# Patient Record
Sex: Male | Born: 1970 | Race: White | Hispanic: No | Marital: Married | Smoking: Former smoker
Health system: Southern US, Community
[De-identification: ages and names within clinical notes are randomized; demographics above are authoritative.]

## PROBLEM LIST (undated history)

## (undated) DIAGNOSIS — F419 Anxiety disorder, unspecified: Secondary | ICD-10-CM

## (undated) DIAGNOSIS — K219 Gastro-esophageal reflux disease without esophagitis: Secondary | ICD-10-CM

## (undated) DIAGNOSIS — E785 Hyperlipidemia, unspecified: Secondary | ICD-10-CM

---

## 2019-06-04 ENCOUNTER — Other Ambulatory Visit: Payer: Self-pay

## 2019-06-04 ENCOUNTER — Encounter: Payer: Self-pay | Admitting: Emergency Medicine

## 2019-06-04 ENCOUNTER — Ambulatory Visit
Admission: EM | Admit: 2019-06-04 | Discharge: 2019-06-04 | Disposition: A | Discharge status: Home / Self Care | Payer: No Typology Code available for payment source | Attending: Family Medicine | Admitting: Family Medicine

## 2019-06-04 DIAGNOSIS — R1032 Left lower quadrant pain: Secondary | ICD-10-CM | POA: Insufficient documentation

## 2019-06-04 DIAGNOSIS — N401 Enlarged prostate with lower urinary tract symptoms: Secondary | ICD-10-CM | POA: Diagnosis present

## 2019-06-04 HISTORY — DX: Gastro-esophageal reflux disease without esophagitis: K21.9

## 2019-06-04 HISTORY — DX: Hyperlipidemia, unspecified: E78.5

## 2019-06-04 HISTORY — DX: Anxiety disorder, unspecified: F41.9

## 2019-06-04 LAB — URINALYSIS, COMPLETE (UACMP) WITH MICROSCOPIC
Bacteria, UA: NONE SEEN
Bilirubin Urine: NEGATIVE
Glucose, UA: NEGATIVE mg/dL
Hgb urine dipstick: NEGATIVE
Ketones, ur: NEGATIVE mg/dL
Leukocytes,Ua: NEGATIVE
Nitrite: NEGATIVE
Protein, ur: NEGATIVE mg/dL
Specific Gravity, Urine: 1.025 (ref 1.005–1.030)
Squamous Epithelial / HPF: NONE SEEN (ref 0–5)
pH: 7 (ref 5.0–8.0)

## 2019-06-04 MED ORDER — AMOXICILLIN-POT CLAVULANATE 875-125 MG PO TABS: 1.0000 | ORAL_TABLET | Freq: Two times a day (BID) | ORAL | 0 refills | Status: DC

## 2019-06-04 MED ORDER — TAMSULOSIN HCL 0.4 MG PO CAPS: 0.4000 mg | ORAL_CAPSULE | Freq: Every day | ORAL | 0 refills | Status: DC

## 2019-06-04 NOTE — ED Triage Notes (Signed)
Patient c/o lower mid abdominal pain that started a month ago.  Patient c/o urinary frequency that started last week.  Patient denies fevers.   Patient denies N/V/D.

## 2019-06-06 NOTE — ED Provider Notes (Signed)
[facility]    CSN: [id number] Arrival date & time: 06/04/19  1335      History   Chief Complaint Chief Complaint  Patient presents with  . Abdominal Pain    HPI Christian Morton is a 49 y.o. male.   49 yo male with a c/o mid and left lower abdominal pains for the past month and increased urinary frequency for the past week. States he gets up 2 times per night to urinate and has sensation of incomplete emptying. Denies any fevers, chills, vomiting, nausea, diarrhea, constipation, melena, hematochezia, back pain.    Abdominal Pain   Past Medical History:  Diagnosis Date  . Anxiety   . GERD (gastroesophageal reflux disease)   . Hyperlipidemia     There are no problems to display for this patient.   History reviewed. No pertinent surgical history.     Home Medications    Prior to Admission medications   Medication Sig Start Date End Date Taking? Authorizing Provider  buPROPion (WELLBUTRIN XL) 300 MG 24 hr tablet bupropion HCl XL 300 mg 24 hr tablet, extended release   Yes [provider]  pantoprazole (PROTONIX) 40 MG tablet pantoprazole 40 mg tablet,delayed release   Yes [provider]  simvastatin (ZOCOR) 80 MG tablet simvastatin 80 mg tablet   Yes [provider]  amoxicillin-clavulanate (AUGMENTIN) 875-125 MG tablet Take 1 tablet by mouth 2 (two) times daily. 06/04/19   Norval Gable, MD  tamsulosin (FLOMAX) 0.4 MG CAPS capsule Take 1 capsule (0.4 mg total) by mouth daily. 06/04/19   Norval Gable, MD    Family History Family History  Problem Relation Age of Onset  . Cancer Mother   . Healthy Father     Social History Social History   Tobacco Use  . Smoking status: Former Research scientist (life sciences)  . Smokeless tobacco: Never Used  Substance Use Topics  . Alcohol use: Yes  . Drug use: Never     Allergies   Patient has no known allergies.   Review of Systems Review of Systems  Gastrointestinal: Positive for abdominal pain.      Physical Exam Triage Vital Signs ED Triage Vitals  Enc Vitals Group     BP 06/04/19 1403 132/90     Pulse Rate 06/04/19 1403 82     Resp 06/04/19 1403 16     Temp 06/04/19 1403 98.5 F (36.9 C)     Temp Source 06/04/19 1403 Oral     SpO2 06/04/19 1403 100 %     Weight 06/04/19 1358 200 lb (90.7 kg)     Height 06/04/19 1358 6\' 2"  (1.88 m)     Head Circumference --      Peak Flow --      Pain Score 06/04/19 1358 2     Pain Loc --      Pain Edu? --      Excl. in [place]? --    No data found.  Updated Vital Signs BP 132/90 (BP Location: Right Arm)   Pulse 82   Temp 98.5 F (36.9 C) (Oral)   Resp 16   Ht 6\' 2"  (1.88 m)   Wt 90.7 kg   SpO2 100%   BMI 25.68 kg/m   Visual Acuity Right Eye Distance:   Left Eye Distance:   Bilateral Distance:    Right Eye Near:   Left Eye Near:    Bilateral Near:     Physical Exam Vitals and nursing note reviewed.  Constitutional:  General: He is not in acute distress.    Appearance: He is not toxic-appearing or diaphoretic.  Abdominal:     General: Bowel sounds are normal. There is no distension.     Palpations: Abdomen is soft. There is no mass.     Tenderness: There is abdominal tenderness (mild; left lower quadrant; no rebound or guarding). There is no right CVA tenderness, left CVA tenderness, guarding or rebound.     Hernia: No hernia is present. There is no hernia in the left inguinal area or right inguinal area.  Genitourinary:    Penis: Normal.      Testes: Normal.     Prostate: Enlarged (difffusely). Not tender and no nodules present.  Neurological:     Mental Status: He is alert.      UC Treatments / Results  Labs (all labs ordered are listed, but only abnormal results are displayed) Labs Reviewed  URINALYSIS, COMPLETE (UACMP) WITH MICROSCOPIC    EKG   Radiology No results found.  Procedures Procedures (including critical care time)  Medications Ordered in UC Medications - No data to  display  Initial Impression / Assessment and Plan / UC Course  I have reviewed the triage vital signs and the nursing notes.  Pertinent labs & imaging results that were available during my care of the patient were reviewed by me and considered in my medical decision making (see chart for details).      Final Clinical Impressions(s) / UC Diagnoses   Final diagnoses:  Benign prostatic hyperplasia with lower urinary tract symptoms, symptom details unspecified  Abdominal pain, left lower quadrant    ED Prescriptions    Medication Sig Dispense Auth. Provider   amoxicillin-clavulanate (AUGMENTIN) 875-125 MG tablet Take 1 tablet by mouth 2 (two) times daily. 20 tablet Payton Mccallum, MD   tamsulosin (FLOMAX) 0.4 MG CAPS capsule Take 1 capsule (0.4 mg total) by mouth daily. 30 capsule Payton Mccallum, MD     1. Lab results (negative UA) and diagnosis reviewed with patient 2. rx as per orders above; reviewed possible side effects, interactions, risks and benefits  3. Recommend supportive treatment with increase water intake, otc analgesics prn 4. Follow-up prn if symptoms worsen or don't improve   PDMP not reviewed this encounter.   Payton Mccallum, MD 06/06/19 1154

## 2020-04-14 ENCOUNTER — Other Ambulatory Visit: Payer: Self-pay

## 2020-04-14 ENCOUNTER — Ambulatory Visit
Admission: EM | Admit: 2020-04-14 | Discharge: 2020-04-14 | Disposition: A | Discharge status: Home / Self Care | Payer: No Typology Code available for payment source | Attending: Family Medicine | Admitting: Family Medicine

## 2020-04-14 DIAGNOSIS — U071 COVID-19: Secondary | ICD-10-CM

## 2020-04-14 MED ORDER — ALBUTEROL SULFATE HFA 108 (90 BASE) MCG/ACT IN AERS: 1.0000 | INHALATION_SPRAY | Freq: Four times a day (QID) | RESPIRATORY_TRACT | 0 refills | Status: AC | PRN

## 2020-04-14 MED ORDER — BENZONATATE 200 MG PO CAPS: 200.0000 mg | ORAL_CAPSULE | Freq: Three times a day (TID) | ORAL | 0 refills | Status: DC | PRN

## 2020-04-14 NOTE — ED Triage Notes (Signed)
Pt c/o recent COVID positive test at home about 2 weeks ago. Pt was also tested at [facility] and [facility] and those were also positive. Pt states his symptoms started about 04/02/20 and he took the first test 04/04/20. Pt states he still has a dry cough, occasionally productive. Pt reports minimal shob, denies wheeze/chest tightness. Pt is asking for inhaler to help with cough. Pt also needs note to RTW. Pt denies f/n/v/d or other symptoms.

## 2020-04-14 NOTE — ED Provider Notes (Signed)
[facility]    CSN: [id number] Arrival date & time: 04/14/20  0816      History   Chief Complaint Chief Complaint  Patient presents with  . Cough   HPI  50 year old male presents with the above complaint.   Tested positive for COVID on 2/1.  He states that he has had fatigue.  He has had ongoing cough.  He is feeling improved but is still bothered by the cough.  No shortness of breath.  No fever.  He has no other complaints or concerns at this time.  He does need a note for work.  Past Medical History:  Diagnosis Date  . Anxiety   . GERD (gastroesophageal reflux disease)   . Hyperlipidemia     Home Medications    Prior to Admission medications   Medication Sig Start Date End Date Taking? Authorizing Provider  albuterol (VENTOLIN HFA) 108 (90 Base) MCG/ACT inhaler Inhale 1-2 puffs into the lungs every 6 (six) hours as needed for wheezing or shortness of breath. 04/14/20  Yes Hildegard Hlavac G, DO  benzonatate (TESSALON) 200 MG capsule Take 1 capsule (200 mg total) by mouth 3 (three) times daily as needed for cough. 04/14/20  Yes Tierre Netto G, DO  buPROPion (WELLBUTRIN XL) 300 MG 24 hr tablet bupropion HCl XL 300 mg 24 hr tablet, extended release   Yes [provider]  pantoprazole (PROTONIX) 40 MG tablet pantoprazole 40 mg tablet,delayed release   Yes [provider]  simvastatin (ZOCOR) 80 MG tablet simvastatin 80 mg tablet   Yes [provider]  tamsulosin (FLOMAX) 0.4 MG CAPS capsule Take 1 capsule (0.4 mg total) by mouth daily. 06/04/19   Payton Mccallum, MD    Family History Family History  Problem Relation Age of Onset  . Cancer Mother   . Healthy Father     Social History Social History   Tobacco Use  . Smoking status: Former Games developer  . Smokeless tobacco: Never Used  Vaping Use  . Vaping Use: Never used  Substance Use Topics  . Alcohol use: Yes  . Drug use: Never     Allergies   Patient has no known  allergies.   Review of Systems Review of Systems  Constitutional: Positive for fatigue.  Respiratory: Positive for cough.    Physical Exam Triage Vital Signs ED Triage Vitals  Enc Vitals Group     BP 04/14/20 0830 123/78     Pulse Rate 04/14/20 0830 91     Resp 04/14/20 0830 18     Temp 04/14/20 0830 98 F (36.7 C)     Temp Source 04/14/20 0830 Oral     SpO2 04/14/20 0830 100 %     Weight 04/14/20 0828 190 lb (86.2 kg)     Height 04/14/20 0828 6\' 2"  (1.88 m)     Head Circumference --      Peak Flow --      Pain Score 04/14/20 0828 0     Pain Loc --      Pain Edu? --      Excl. in GC? --    Updated Vital Signs BP 123/78 (BP Location: Left Arm)   Pulse 91   Temp 98 F (36.7 C) (Oral)   Resp 18   Ht 6\' 2"  (1.88 m)   Wt 86.2 kg   SpO2 100%   BMI 24.39 kg/m   Visual Acuity Right Eye Distance:   Left Eye Distance:   Bilateral  Distance:    Right Eye Near:   Left Eye Near:    Bilateral Near:     Physical Exam Vitals and nursing note reviewed.  Constitutional:      General: He is not in acute distress.    Appearance: Normal appearance. He is not ill-appearing.  HENT:     Head: Normocephalic and atraumatic.  Eyes:     General:        Right eye: No discharge.        Left eye: No discharge.     Conjunctiva/sclera: Conjunctivae normal.  Cardiovascular:     Rate and Rhythm: Normal rate and regular rhythm.     Heart sounds: No murmur heard.   Pulmonary:     Effort: Pulmonary effort is normal.     Breath sounds: Normal breath sounds. No wheezing, rhonchi or rales.  Neurological:     Mental Status: He is alert.  Psychiatric:        Mood and Affect: Mood normal.        Behavior: Behavior normal.    UC Treatments / Results  Labs (all labs ordered are listed, but only abnormal results are displayed) Labs Reviewed - No data to display  EKG   Radiology No results found.  Procedures Procedures (including critical care time)  Medications Ordered in  UC Medications - No data to display  Initial Impression / Assessment and Plan / UC Course  I have reviewed the triage vital signs and the nursing notes.  Pertinent labs & imaging results that were available during my care of the patient were reviewed by me and considered in my medical decision making (see chart for details).    50 year old male presents with COVID. Well appearing. Bothered by cough. Lungs clear. Tessalon perles and albuterol as needed. Work note given.   Final Clinical Impressions(s) / UC Diagnoses   Final diagnoses:  COVID     Discharge Instructions     Medication as prescribed.  Take care  Dr. Adriana Simas    ED Prescriptions    Medication Sig Dispense Auth. Provider   benzonatate (TESSALON) 200 MG capsule Take 1 capsule (200 mg total) by mouth 3 (three) times daily as needed for cough. 30 capsule Jayland Null G, DO   albuterol (VENTOLIN HFA) 108 (90 Base) MCG/ACT inhaler Inhale 1-2 puffs into the lungs every 6 (six) hours as needed for wheezing or shortness of breath. 18 g Tommie Sams, DO     PDMP not reviewed this encounter.   Tommie Sams, [place] 04/14/20 [phone]

## 2020-04-14 NOTE — Discharge Instructions (Signed)
Medication as prescribed.  Take care  Dr. Sanita Estrada  

## 2020-10-22 ENCOUNTER — Encounter: Payer: Self-pay | Admitting: Emergency Medicine

## 2020-10-22 ENCOUNTER — Other Ambulatory Visit: Payer: Self-pay

## 2020-10-22 ENCOUNTER — Ambulatory Visit (INDEPENDENT_AMBULATORY_CARE_PROVIDER_SITE_OTHER): Payer: No Typology Code available for payment source

## 2020-10-22 ENCOUNTER — Ambulatory Visit
Admission: EM | Admit: 2020-10-22 | Discharge: 2020-10-22 | Disposition: A | Discharge status: Home / Self Care | Payer: No Typology Code available for payment source | Attending: Internal Medicine | Admitting: Internal Medicine

## 2020-10-22 DIAGNOSIS — M25531 Pain in right wrist: Secondary | ICD-10-CM

## 2020-10-22 DIAGNOSIS — Z23 Encounter for immunization: Secondary | ICD-10-CM

## 2020-10-22 MED ORDER — TETANUS-DIPHTH-ACELL PERTUSSIS 5-2.5-18.5 LF-MCG/0.5 IM SUSY
0.5000 mL | PREFILLED_SYRINGE | Freq: Once | INTRAMUSCULAR | Status: AC
  Administered 2020-10-22: 0.5 mL via INTRAMUSCULAR

## 2020-10-22 MED ORDER — HYDROCODONE-ACETAMINOPHEN 5-325 MG PO TABS: 1.0000 | ORAL_TABLET | ORAL | 0 refills | Status: AC | PRN

## 2020-10-22 NOTE — ED Provider Notes (Signed)
[facility]    CSN: [id number] Arrival date & time: 10/22/20  0825      History   Chief Complaint Chief Complaint  Patient presents with   Fall   Wrist Pain    right    HPI Christian Morton is a 50 y.o. male.   HPI  Wrist pain: Patient reports that he was skateboarding last night and fell on an outstretched hand onto his right hand.  Immediately had right wrist pain.  Pain rated 9 out of 10 in nature.  He denies any numbness or tingling of the hand.  There is pain to range of motion of the wrist throughout.  He has taken Tylenol for symptoms which helps minimally.  He reports that he has had many injuries of the wrists and hands but none recently.  Past Medical History:  Diagnosis Date   Anxiety    GERD (gastroesophageal reflux disease)    Hyperlipidemia     There are no problems to display for this patient.   History reviewed. No pertinent surgical history.     Home Medications    Prior to Admission medications   Medication Sig Start Date End Date Taking? Authorizing Provider  buPROPion (WELLBUTRIN XL) 300 MG 24 hr tablet bupropion HCl XL 300 mg 24 hr tablet, extended release   Yes [provider]  pantoprazole (PROTONIX) 40 MG tablet pantoprazole 40 mg tablet,delayed release   Yes [provider]  simvastatin (ZOCOR) 80 MG tablet simvastatin 80 mg tablet   Yes [provider]  albuterol (VENTOLIN HFA) 108 (90 Base) MCG/ACT inhaler Inhale 1-2 puffs into the lungs every 6 (six) hours as needed for wheezing or shortness of breath. 04/14/20   Tommie Sams, DO  benzonatate (TESSALON) 200 MG capsule Take 1 capsule (200 mg total) by mouth 3 (three) times daily as needed for cough. 04/14/20   Tommie Sams, DO  tamsulosin (FLOMAX) 0.4 MG CAPS capsule Take 1 capsule (0.4 mg total) by mouth daily. 06/04/19   Payton Mccallum, MD    Family History Family History  Problem Relation Age of Onset   Cancer Mother    Healthy Father     Social  History Social History   Tobacco Use   Smoking status: Former   Smokeless tobacco: Never  Building services engineer Use: Never used  Substance Use Topics   Alcohol use: Yes   Drug use: Never     Allergies   Patient has no known allergies.   Review of Systems Review of Systems  As stated above in HPI Physical Exam Triage Vital Signs ED Triage Vitals  Enc Vitals Group     BP 10/22/20 0836 (!) 141/105     Pulse Rate 10/22/20 0836 96     Resp 10/22/20 0836 16     Temp 10/22/20 0836 98 F (36.7 C)     Temp Source 10/22/20 0836 Oral     SpO2 10/22/20 0836 100 %     Weight 10/22/20 0833 190 lb (86.2 kg)     Height 10/22/20 0833 6' (1.829 m)     Head Circumference --      Peak Flow --      Pain Score 10/22/20 0833 9     Pain Loc --      Pain Edu? --      Excl. in [place]? --    No data found.  Updated Vital Signs BP (!) 141/105 (BP Location: Left Arm)  Pulse 96   Temp 98 F (36.7 C) (Oral)   Resp 16   Ht 6' (1.829 m)   Wt 190 lb (86.2 kg)   SpO2 100%   BMI 25.77 kg/m   Physical Exam Vitals and nursing note reviewed.  Constitutional:      Appearance: Normal appearance.  Cardiovascular:     Pulses: Normal pulses.  Musculoskeletal:     Comments: Range of motion of the right wrist limited by 50% throughout.  He has more tenderness to flexion and extension then side to side movement.  No specific point tenderness.  Negative fracture testing  Skin:    General: Skin is warm.     Coloration: Skin is not pale.     Findings: No bruising.  Neurological:     General: No focal deficit present.     Mental Status: He is alert.     Sensory: No sensory deficit.     Motor: No weakness.     UC Treatments / Results  Labs (all labs ordered are listed, but only abnormal results are displayed) Labs Reviewed - No data to display  EKG   Radiology No results found.  Procedures Procedures (including critical care time)  Medications Ordered in UC Medications - No data to  display  Initial Impression / Assessment and Plan / UC Course  I have reviewed the triage vital signs and the nursing notes.  Pertinent labs & imaging results that were available during my care of the patient were reviewed by me and considered in my medical decision making (see chart for details).     New.  Wrist X-ray pending.  Update.  Wrist x-ray is suggestive of a potential fracture of the scaphoid and a dedicated hand x-ray recommended.  Patient agreeable.  We will splint, refer him to orthopedics and he has requested a stronger pain medication.  We are going to prescribe Norco.  Discussed with patient including common potential side effects, blackbox warnings and precautions.  Follow-up as needed. Final Clinical Impressions(s) / UC Diagnoses   Final diagnoses:  None   Discharge Instructions   None    ED Prescriptions   None    PDMP not reviewed this encounter.   Rushie Chestnut, [place] 10/22/20 [phone]

## 2020-10-22 NOTE — ED Triage Notes (Signed)
Patient states that he fell off his skateboard last night and injured his right wrist.  Patient denies any other injuries.

## 2022-02-14 ENCOUNTER — Ambulatory Visit
Admission: EM | Admit: 2022-02-14 | Discharge: 2022-02-14 | Disposition: A | Discharge status: Home / Self Care | Payer: No Typology Code available for payment source

## 2022-02-14 ENCOUNTER — Encounter: Payer: Self-pay | Admitting: Emergency Medicine

## 2022-02-14 DIAGNOSIS — J069 Acute upper respiratory infection, unspecified: Secondary | ICD-10-CM | POA: Diagnosis not present

## 2022-02-14 MED ORDER — BENZONATATE 100 MG PO CAPS: 100.0000 mg | ORAL_CAPSULE | Freq: Three times a day (TID) | ORAL | 0 refills | Status: DC

## 2022-02-14 MED ORDER — AMOXICILLIN 500 MG PO CAPS: 500.0000 mg | ORAL_CAPSULE | Freq: Two times a day (BID) | ORAL | 0 refills | Status: AC

## 2022-02-14 MED ORDER — PROMETHAZINE-DM 6.25-15 MG/5ML PO SYRP: 5.0000 mL | ORAL_SOLUTION | Freq: Four times a day (QID) | ORAL | 0 refills | Status: DC | PRN

## 2022-02-14 NOTE — ED Triage Notes (Signed)
Pt c/o cough, watery eyes, nasal congestion. Started about 2 weeks ago. Denies fever. He states this morning he has blood tinted sputum.

## 2022-02-14 NOTE — ED Provider Notes (Signed)
[facility]    CSN: [id number] Arrival date & time: 02/14/22  1615      History   Chief Complaint Chief Complaint  Patient presents with   Cough    HPI Christian Morton is a 51 y.o. male.   Patient presents for evaluation of nasal congestion, rhinorrhea, sore throat, cough and wheezing for 2 weeks.  Known sick contact at work, child also has similar symptoms, tested negative for COVID today.  Cough has been productive, noticed blood to sputum 3 to 4 days ago.  Wheezing is heard intermittently.  Denies respiratory history.  Non-smoker.  Tolerating food and liquids.  Has symptoms use of children's cough medicine which has been ineffective.   Past Medical History:  Diagnosis Date   Anxiety    GERD (gastroesophageal reflux disease)    Hyperlipidemia     There are no problems to display for this patient.   History reviewed. No pertinent surgical history.     Home Medications    Prior to Admission medications   Medication Sig Start Date End Date Taking? Authorizing Provider  buPROPion (WELLBUTRIN XL) 300 MG 24 hr tablet bupropion HCl XL 300 mg 24 hr tablet, extended release   Yes [provider]  lisinopril (ZESTRIL) 20 MG tablet Take 0.5 tablets by mouth daily. 09/05/21  Yes [provider]  pantoprazole (PROTONIX) 40 MG tablet pantoprazole 40 mg tablet,delayed release   Yes [provider]  simvastatin (ZOCOR) 80 MG tablet simvastatin 80 mg tablet   Yes [provider]  albuterol (VENTOLIN HFA) 108 (90 Base) MCG/ACT inhaler Inhale 1-2 puffs into the lungs every 6 (six) hours as needed for wheezing or shortness of breath. 04/14/20   Tommie Sams, DO    Family History Family History  Problem Relation Age of Onset   Cancer Mother    Healthy Father     Social History Social History   Tobacco Use   Smoking status: Former   Smokeless tobacco: Never  Building services engineer Use: Some days  Substance Use Topics   Alcohol use:  Yes   Drug use: Yes    Types: Marijuana     Allergies   Patient has no known allergies.   Review of Systems Review of Systems  Constitutional: Negative.   HENT:  Positive for congestion, rhinorrhea and sore throat. Negative for dental problem, drooling, ear discharge, ear pain, facial swelling, hearing loss, mouth sores, nosebleeds, postnasal drip, sinus pressure, sinus pain, sneezing, tinnitus, trouble swallowing and voice change.   Respiratory:  Positive for cough and wheezing. Negative for apnea, choking, chest tightness, shortness of breath and stridor.   Cardiovascular: Negative.   Skin: Negative.   Neurological: Negative.      Physical Exam Triage Vital Signs ED Triage Vitals  Enc Vitals Group     BP 02/14/22 1650 115/76     Pulse Rate 02/14/22 1650 84     Resp 02/14/22 1650 18     Temp 02/14/22 1650 98.8 F (37.1 C)     Temp Source 02/14/22 1650 Oral     SpO2 02/14/22 1650 98 %     Weight 02/14/22 1649 190 lb 0.6 oz (86.2 kg)     Height 02/14/22 1649 6' (1.829 m)     Head Circumference --      Peak Flow --      Pain Score 02/14/22 1647 0     Pain Loc --      Pain Edu? --  Excl. in [place]? --    No data found.  Updated Vital Signs BP 115/76 (BP Location: Left Arm)   Pulse 84   Temp 98.8 F (37.1 C) (Oral)   Resp 18   Ht 6' (1.829 m)   Wt 190 lb 0.6 oz (86.2 kg)   SpO2 98%   BMI 25.77 kg/m   Visual Acuity Right Eye Distance:   Left Eye Distance:   Bilateral Distance:    Right Eye Near:   Left Eye Near:    Bilateral Near:     Physical Exam Constitutional:      Appearance: Normal appearance.  HENT:     Head: Normocephalic.     Right Ear: Tympanic membrane, ear canal and external ear normal.     Left Ear: Tympanic membrane, ear canal and external ear normal.     Nose: Congestion and rhinorrhea present.     Mouth/Throat:     Mouth: Mucous membranes are moist.     Pharynx: Posterior oropharyngeal erythema present.  Cardiovascular:     Rate  and Rhythm: Normal rate and regular rhythm.     Pulses: Normal pulses.     Heart sounds: Normal heart sounds.  Pulmonary:     Effort: Pulmonary effort is normal.     Breath sounds: Normal breath sounds.  Musculoskeletal:        General: Normal range of motion.  Skin:    General: Skin is warm and dry.  Neurological:     Mental Status: He is alert and oriented to person, place, and time. Mental status is at baseline.  Psychiatric:        Mood and Affect: Mood normal.        Behavior: Behavior normal.      UC Treatments / Results  Labs (all labs ordered are listed, but only abnormal results are displayed) Labs Reviewed - No data to display  EKG   Radiology No results found.  Procedures Procedures (including critical care time)  Medications Ordered in UC Medications - No data to display  Initial Impression / Assessment and Plan / UC Course  I have reviewed the triage vital signs and the nursing notes.  Pertinent labs & imaging results that were available during my care of the patient were reviewed by me and considered in my medical decision making (see chart for details).  Acute URI  Patient is in no signs of distress nor toxic appearing.  Vital signs are stable.  Low suspicion for pneumonia, pneumothorax or bronchitis and therefore will defer imaging.  Hemoptysis is most likely result of a dry airway.  Viral testing deferred due to timeline of illness.  As symptoms have progressed for 14 days with no improvement will prescribe antibiotic for bacterial coverage.  Amoxicillin, Tessalon and Promethazine DM prescribed for management. May use additional over-the-counter medications as needed for supportive care.  May follow-up with urgent care as needed if symptoms persist or worsen.  Final Clinical Impressions(s) / UC Diagnoses   Final diagnoses:  None   Discharge Instructions   None    ED Prescriptions   None    PDMP not reviewed this encounter.   Valinda Hoar, NP 02/14/22 1710

## 2022-02-14 NOTE — Discharge Instructions (Signed)
Even though your symptoms most likely started off as a virus as they have persisted for at least 14 days without signs of improvement we will provide coverage for bacteria which is most likely prolonging your illness  Begin amoxicillin every morning in the evening for 7 days, ideally will gena see improvement in about 48 hours and steady progression from there  You may use Tessalon pill every 8 hours to help calm your coughing  You may use cough syrup every 6 hours as needed for additional comfort, be mindful this may make you drowsy    You can take Tylenol and/or Ibuprofen as needed for fever reduction and pain relief.   For cough: honey 1/2 to 1 teaspoon (you can dilute the honey in water or another fluid).  You can also use guaifenesin and dextromethorphan for cough. You can use a humidifier for chest congestion and cough.  If you don't have a humidifier, you can sit in the bathroom with the hot shower running.      For sore throat: try warm salt water gargles, cepacol lozenges, throat spray, warm tea or water with lemon/honey, popsicles or ice, or OTC cold relief medicine for throat discomfort.   For congestion: take a daily anti-histamine like Zyrtec, Claritin, and a oral decongestant, such as pseudoephedrine.  You can also use Flonase 1-2 sprays in each nostril daily.   It is important to stay hydrated: drink plenty of fluids (water, gatorade/powerade/pedialyte, juices, or teas) to keep your throat moisturized and help further relieve irritation/discomfort.

## 2022-02-18 IMAGING — CR DG WRIST COMPLETE 3+V*R*
3 series · 3 of 3 positions shown · non-contrast
Comparison: None.

CLINICAL DATA: Fall off skateboard last night. Right wrist injury
and pain. Initial encounter.

EXAM:
RIGHT WRIST - COMPLETE 3+ VIEW

[wrist pa]
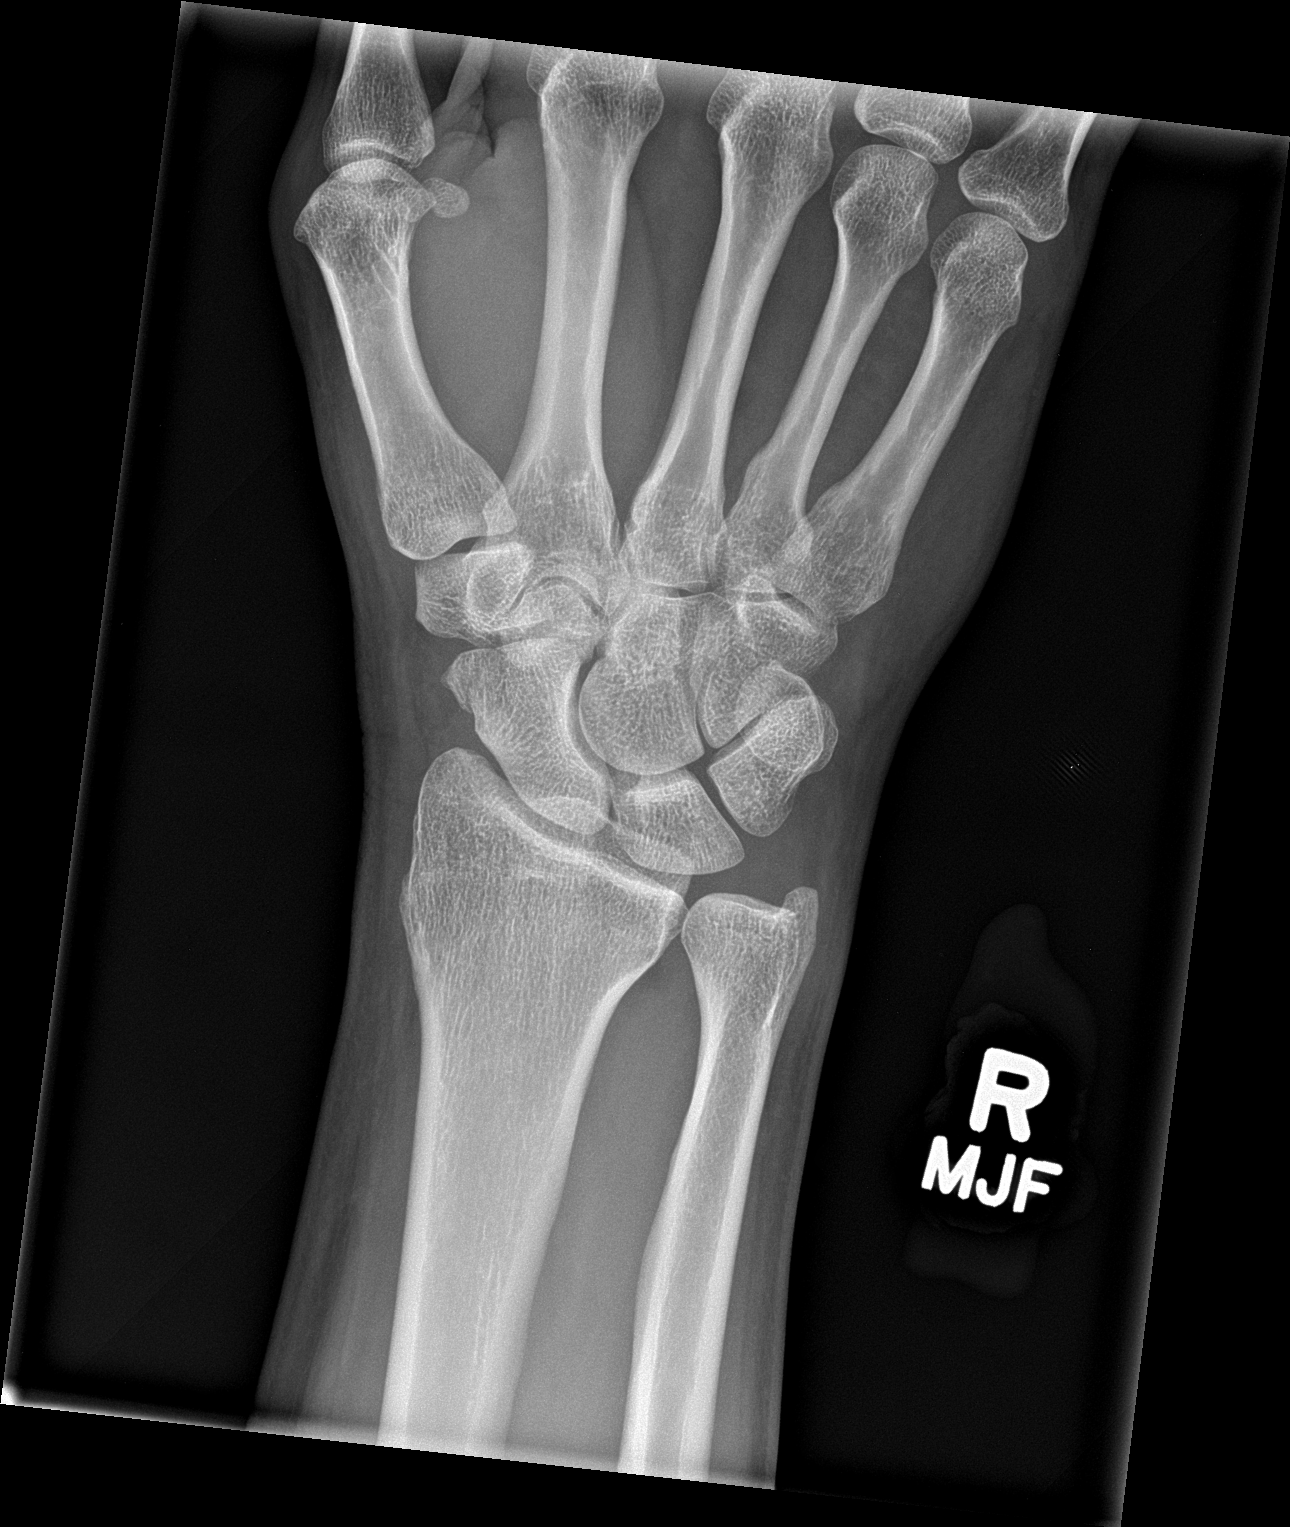

[wrist obl]
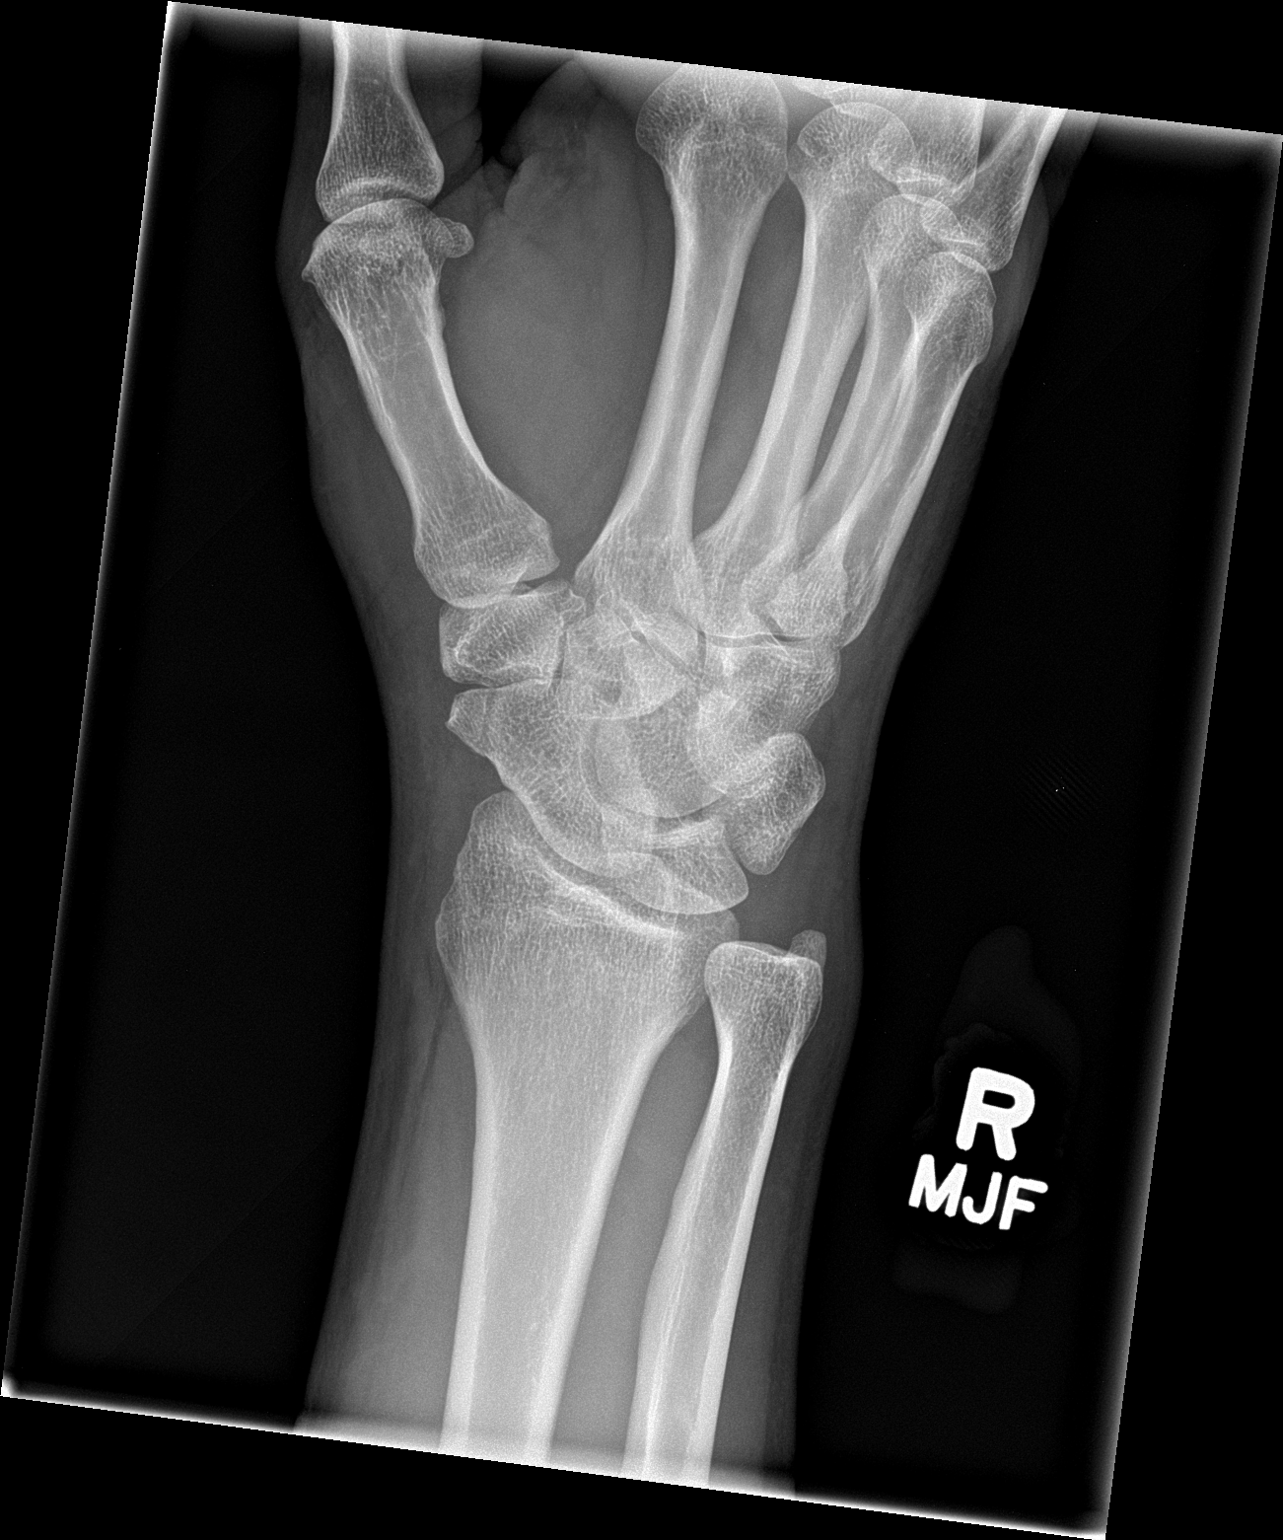

[wrist lat]
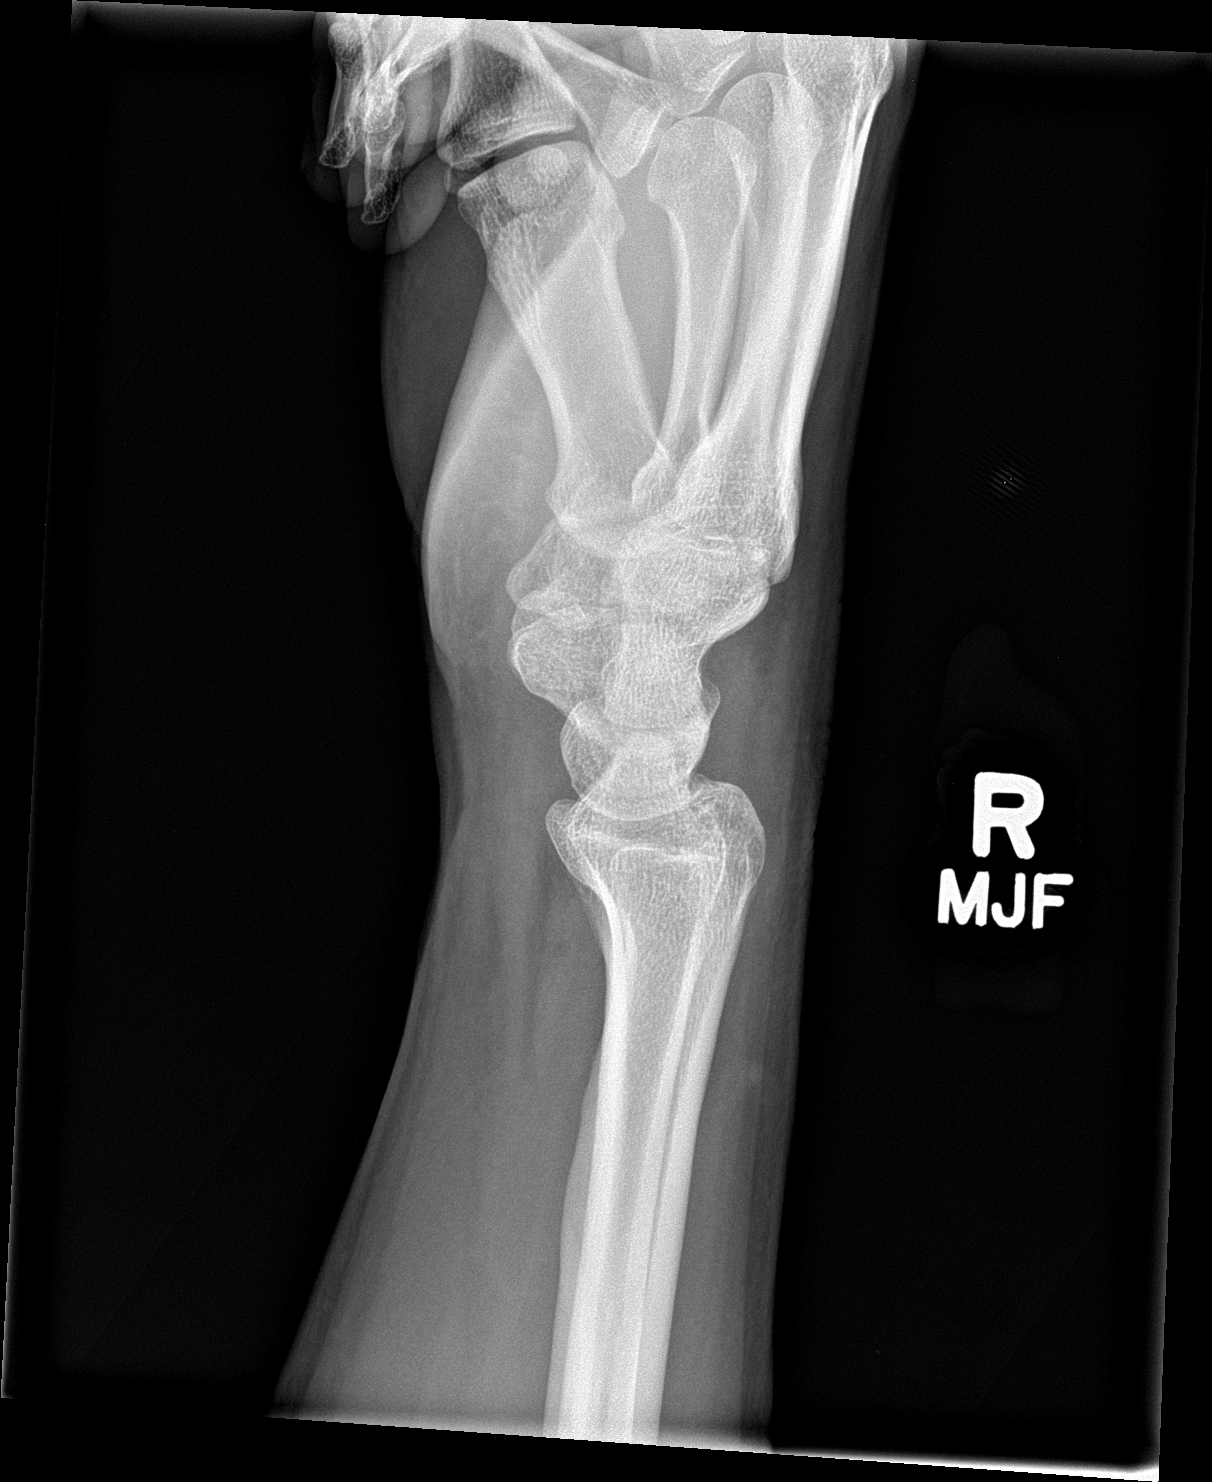

[3 of 3 positions shown; findings below may reference images not displayed]

FINDINGS: Linear lucency through the radial aspect of the scaphoid seen on the
AP projection is suspicious for a nondisplaced fracture. No other
fractures are identified. No evidence of dislocation.
IMPRESSION: Suspect nondisplaced fracture through the radial aspect of the
scaphoid. Recommend dedicated hand radiographs for further
evaluation.

## 2023-07-01 ENCOUNTER — Ambulatory Visit
Admission: EM | Admit: 2023-07-01 | Discharge: 2023-07-01 | Disposition: A | Discharge status: Home / Self Care | Attending: Emergency Medicine | Admitting: Emergency Medicine

## 2023-07-01 ENCOUNTER — Encounter: Payer: Self-pay | Admitting: Emergency Medicine

## 2023-07-01 DIAGNOSIS — J069 Acute upper respiratory infection, unspecified: Secondary | ICD-10-CM

## 2023-07-01 LAB — RESP PANEL BY RT-PCR (FLU A&B, COVID) ARPGX2
Influenza A by PCR: NEGATIVE
Influenza B by PCR: NEGATIVE
SARS Coronavirus 2 by RT PCR: NEGATIVE

## 2023-07-01 MED ORDER — IPRATROPIUM BROMIDE 0.06 % NA SOLN: 2.0000 | Freq: Four times a day (QID) | NASAL | 12 refills | Status: AC

## 2023-07-01 MED ORDER — PROMETHAZINE-DM 6.25-15 MG/5ML PO SYRP: 5.0000 mL | ORAL_SOLUTION | Freq: Four times a day (QID) | ORAL | 0 refills | Status: AC | PRN

## 2023-07-01 MED ORDER — BENZONATATE 100 MG PO CAPS: 200.0000 mg | ORAL_CAPSULE | Freq: Three times a day (TID) | ORAL | 0 refills | Status: AC

## 2023-07-01 NOTE — ED Triage Notes (Signed)
 Pt presents with a dry cough and scratchy throat since yesterday.

## 2023-07-01 NOTE — ED Provider Notes (Signed)
 [facility]    CSN: [id number] Arrival date & time: 07/01/23  1443      History   Chief Complaint Chief Complaint  Patient presents with   Cough    HPI Christian Morton is a 53 y.o. male.   HPI  53 year old male with past medical history significant for hyperlipidemia, GERD, and anxiety presents for evaluation of respiratory symptoms that started yesterday.  He reports that a week and 1/2 to 2 weeks ago he did travel to [place] but he is unaware of any sick contacts.  His symptoms include runny nose, nasal congestion, scratchy throat, nonproductive cough, and wheezing.  He denies any fever, ear pain, shortness of breath, or GI symptoms.  He took a home COVID test which was negative.  Past Medical History:  Diagnosis Date   Anxiety    GERD (gastroesophageal reflux disease)    Hyperlipidemia     There are no active problems to display for this patient.   History reviewed. No pertinent surgical history.     Home Medications    Prior to Admission medications   Medication Sig Start Date End Date Taking? Authorizing Provider  benzonatate  (TESSALON ) 100 MG capsule Take 2 capsules (200 mg total) by mouth every 8 (eight) hours. 07/01/23  Yes Kent Pear, NP  cetirizine (ZYRTEC) 10 MG tablet Take 10 mg by mouth. 05/26/23  Yes [provider]  ipratropium (ATROVENT) 0.06 % nasal spray Place 2 sprays into both nostrils 4 (four) times daily. 07/01/23  Yes Kent Pear, NP  promethazine -dextromethorphan (PROMETHAZINE -DM) 6.25-15 MG/5ML syrup Take 5 mLs by mouth 4 (four) times daily as needed. 07/01/23  Yes Kent Pear, NP  sertraline (ZOLOFT) 100 MG tablet Take 50 mg by mouth. 06/24/23  Yes [provider]  albuterol  (VENTOLIN  HFA) 108 (90 Base) MCG/ACT inhaler Inhale 1-2 puffs into the lungs every 6 (six) hours as needed for wheezing or shortness of breath. 04/14/20   Cook, Jayce G, DO  buPROPion (WELLBUTRIN XL) 300 MG 24 hr tablet bupropion HCl XL 300 mg 24  hr tablet, extended release    [provider]  lisinopril (ZESTRIL) 20 MG tablet Take 0.5 tablets by mouth daily. 09/05/21   [provider]  pantoprazole (PROTONIX) 40 MG tablet pantoprazole 40 mg tablet,delayed release    [provider]  simvastatin (ZOCOR) 80 MG tablet simvastatin 80 mg tablet    [provider]    Family History Family History  Problem Relation Age of Onset   Cancer Mother    Healthy Father     Social History Social History   Tobacco Use   Smoking status: Former   Smokeless tobacco: Never  [employer]vertising account [employer]   Vaping status: Some Days  Substance Use Topics   Alcohol use: Yes   Drug use: Yes    Types: Marijuana     Allergies   Patient has no known allergies.   Review of Systems Review of Systems  Constitutional:  Negative for fever.  HENT:  Positive for congestion, postnasal drip, rhinorrhea and sore throat. Negative for ear pain.   Respiratory:  Positive for cough and wheezing. Negative for shortness of breath.      Physical Exam Triage Vital Signs ED Triage Vitals  Encounter Vitals Group     BP      Systolic BP Percentile      Diastolic BP Percentile      Pulse      Resp      Temp  Temp src      SpO2      Weight      Height      Head Circumference      Peak Flow      Pain Score      Pain Loc      Pain Education      Exclude from Growth Chart    No data found.  Updated Vital Signs BP 107/69   Pulse 94   Temp 98.3 F (36.8 C) (Oral)   Resp 18   SpO2 97%   Visual Acuity Right Eye Distance:   Left Eye Distance:   Bilateral Distance:    Right Eye Near:   Left Eye Near:    Bilateral Near:     Physical Exam Vitals and nursing note reviewed.  Constitutional:      Appearance: Normal appearance. He is not ill-appearing.  HENT:     Head: Normocephalic and atraumatic.     Right Ear: Tympanic membrane, ear canal and external ear normal. There is no impacted cerumen.     Left Ear: Tympanic  membrane, ear canal and external ear normal. There is no impacted cerumen.     Nose: Congestion and rhinorrhea present.     Comments: Nasal mucosa is edematous and erythematous with scant clear discharge in both nares.    Mouth/Throat:     Mouth: Mucous membranes are moist.     Pharynx: Oropharynx is clear. Posterior oropharyngeal erythema present. No oropharyngeal exudate.     Comments: Mild erythema to the posterior oropharynx with clear postnasal drip. Cardiovascular:     Rate and Rhythm: Normal rate and regular rhythm.     Pulses: Normal pulses.     Heart sounds: Normal heart sounds. No murmur heard.    No friction rub. No gallop.  Pulmonary:     Effort: Pulmonary effort is normal.     Breath sounds: Normal breath sounds. No wheezing, rhonchi or rales.  Musculoskeletal:     Cervical back: Normal range of motion and neck supple. No tenderness.  Lymphadenopathy:     Cervical: No cervical adenopathy.  Skin:    General: Skin is warm and dry.     Capillary Refill: Capillary refill takes less than 2 seconds.     Findings: No rash.  Neurological:     General: No focal deficit present.     Mental Status: He is alert and oriented to person, place, and time.      UC Treatments / Results  Labs (all labs ordered are listed, but only abnormal results are displayed) Labs Reviewed  RESP PANEL BY RT-PCR (FLU A&B, COVID) [id number]    EKG   Radiology No results found.  Procedures Procedures (including critical care time)  Medications Ordered in UC Medications - No data to display  Initial Impression / Assessment and Plan / UC Course  I have reviewed the triage vital signs and the nursing notes.  Pertinent labs & imaging results that were available during my care of the patient were reviewed by me and considered in my medical decision making (see chart for details).   Patient is a nontoxic-appearing 53 year old male presenting for evaluation of respiratory symptoms as outlined HPI  above.  In the exam room he is not in any acute distress and he is able to speak in full sentence without dyspnea or tachypnea.  He is afebrile with an oral temp of 98.3.  Respiratory rate at triage was 18 with a 97% room  air oxygen saturation.  His physical exam does reveal inflammation of his upper respiratory tract as evidenced by inflamed nasal mucosa with clear nasal discharge.  He also has moderate erythema to the posterior pharynx with clear postnasal drip.  Cardiopulmonary exam reveals clear lung sounds in all fields.  Differential diagnose include COVID, influenza, viral respiratory illness.  I will order a COVID and flu PCR.  Respiratory panel is negative for COVID influenza.  I will discharge patient home with a diagnosis of viral URI with a cough with a prescription for Atrovent nasal spray help with nasal congestion.  Tessalon  Perles Promethazine  DM cough syrup for cough and congestion.   Final Clinical Impressions(s) / UC Diagnoses   Final diagnoses:  Viral URI with cough     Discharge Instructions      Your testing for COVID and flu were both negative.  Your exam does indicate that you have a viral respiratory infection which is causing your symptoms.  Use the Atrovent nasal spray, 2 squirts in each nostril every 6 hours, as needed for runny nose and postnasal drip.  Use the Tessalon  Perles every 8 hours during the day.  Take them with a small sip of water.  They may give you some numbness to the base of your tongue or a metallic taste in your mouth, this is normal.  Use the Promethazine  DM cough syrup at bedtime for cough and congestion.  It will make you drowsy so do not take it during the day.  Return for reevaluation or see your primary care provider for any new or worsening symptoms.      ED Prescriptions     Medication Sig Dispense Auth. Provider   benzonatate  (TESSALON ) 100 MG capsule Take 2 capsules (200 mg total) by mouth every 8 (eight) hours. 21 capsule  Kent Pear, NP   ipratropium (ATROVENT) 0.06 % nasal spray Place 2 sprays into both nostrils 4 (four) times daily. 15 mL Kent Pear, NP   promethazine -dextromethorphan (PROMETHAZINE -DM) 6.25-15 MG/5ML syrup Take 5 mLs by mouth 4 (four) times daily as needed. 118 mL Kent Pear, NP      PDMP not reviewed this encounter.   Kent Pear, NP 07/01/23 1555

## 2023-07-01 NOTE — Discharge Instructions (Addendum)
 Your testing for COVID and flu were both negative.  Your exam does indicate that you have a viral respiratory infection which is causing your symptoms.  Use the Atrovent nasal spray, 2 squirts in each nostril every 6 hours, as needed for runny nose and postnasal drip.  Use the Tessalon  Perles every 8 hours during the day.  Take them with a small sip of water.  They may give you some numbness to the base of your tongue or a metallic taste in your mouth, this is normal.  Use the Promethazine  DM cough syrup at bedtime for cough and congestion.  It will make you drowsy so do not take it during the day.  Return for reevaluation or see your primary care provider for any new or worsening symptoms.
